# Patient Record
Sex: Female | Born: 1960 | Race: Black or African American | Hispanic: No | Smoking: Never smoker
Health system: Southern US, Community
[De-identification: ages and names within clinical notes are randomized; demographics above are authoritative.]

## PROBLEM LIST (undated history)

## (undated) DIAGNOSIS — I10 Essential (primary) hypertension: Secondary | ICD-10-CM

## (undated) HISTORY — PX: CHOLECYSTECTOMY: SHX55

## (undated) HISTORY — DX: Essential (primary) hypertension: I10

---

## 2009-05-18 ENCOUNTER — Ambulatory Visit (HOSPITAL_COMMUNITY): Admission: RE | Admit: 2009-05-18 | Discharge: 2009-05-18 | Payer: Self-pay | Admitting: Family Medicine

## 2011-06-14 ENCOUNTER — Other Ambulatory Visit (HOSPITAL_COMMUNITY): Payer: Self-pay | Admitting: Family Medicine

## 2011-06-14 ENCOUNTER — Ambulatory Visit (HOSPITAL_COMMUNITY)
Admission: RE | Admit: 2011-06-14 | Discharge: 2011-06-14 | Disposition: A | Discharge status: Home / Self Care | Payer: 59 | Source: Ambulatory Visit | Attending: Family Medicine | Admitting: Family Medicine

## 2011-06-14 DIAGNOSIS — M25549 Pain in joints of unspecified hand: Secondary | ICD-10-CM | POA: Insufficient documentation

## 2011-07-07 ENCOUNTER — Encounter: Payer: Self-pay | Admitting: Orthopedic Surgery

## 2011-07-07 ENCOUNTER — Ambulatory Visit (INDEPENDENT_AMBULATORY_CARE_PROVIDER_SITE_OTHER): Payer: 59 | Admitting: Orthopedic Surgery

## 2011-07-07 VITALS — Resp 16 | Ht 64.0 in | Wt 270.0 lb

## 2011-07-07 DIAGNOSIS — M6530 Trigger finger, unspecified finger: Secondary | ICD-10-CM

## 2011-07-07 DIAGNOSIS — M65311 Trigger thumb, right thumb: Secondary | ICD-10-CM

## 2011-07-07 MED ORDER — METHYLPREDNISOLONE ACETATE 40 MG/ML IJ SUSP: 40.0000 mg | Freq: Once | INTRAMUSCULAR | Status: AC

## 2011-07-07 NOTE — Patient Instructions (Signed)
Ice the thumb today 3 x day for 20 minutes  Take Tylenol for pain  Come back August 6th

## 2011-07-07 NOTE — Progress Notes (Signed)
   I cannot bend my RIGHT thumb.  Referral from [facility].  For the last 2 months. The patient states she can't bend her RIGHT thumb. She denies any trauma. Symptoms came on suddenly. The RIGHT thumb is swollen.  Review of systems is negative, except for seasonal allergies.  The history is been reviewed as above.  Exam vital signs are normal. General appearance body habitus is endomorphic. Orientation is normal. X3. Mood and affect are normal. Ambulation normal. RIGHT thumb: Inspection reveals tenderness over the A1 pulley. No tenderness over the IP joint. The flexor tendon remains intact. The thumb is stable. Overall muscle tone in the RIGHT hand is normal. The skin is intact. Her color is good. The digit. The sensation is normal as well.  Pathologic  reflexes such as Hoffman's test is negative. Balance is normal.  An x-ray was done at the hospital. There is a suggestion that there was a fracture at the DI P. Joint from the distal phalanx. However, clinically, this area is nontender with no swelling, and there appears to be nodularity at the A1 pulley. Diagnosis is trigger thumb RIGHT upper extremity.  Recommend injection.  Follow-up visit arranged   Procedure Injection RIGHT trigger thumb Verbal consent was obtained, followed by timeout procedure.  Ethyl chloride was used to prep the skin along with alcohol  Medication injected: 40 mg of Depo-Medrol and 2 cc 1% lidocaine.    Tolerated well without complication.

## 2011-07-20 ENCOUNTER — Ambulatory Visit (INDEPENDENT_AMBULATORY_CARE_PROVIDER_SITE_OTHER): Payer: 59 | Admitting: Orthopedic Surgery

## 2011-07-20 DIAGNOSIS — M6530 Trigger finger, unspecified finger: Secondary | ICD-10-CM

## 2011-07-20 NOTE — Progress Notes (Signed)
Finger RIGHT  Status post injection for triggering RIGHT thumb  Reports symptoms have resolved  She demonstrates normal flexion extension of the RIGHT thumb  Followup as needed improved

## 2012-10-05 ENCOUNTER — Other Ambulatory Visit (HOSPITAL_COMMUNITY): Payer: Self-pay | Admitting: Internal Medicine

## 2012-10-05 DIAGNOSIS — Z139 Encounter for screening, unspecified: Secondary | ICD-10-CM

## 2012-10-11 ENCOUNTER — Ambulatory Visit (HOSPITAL_COMMUNITY): Payer: 59

## 2012-10-15 ENCOUNTER — Ambulatory Visit (HOSPITAL_COMMUNITY)
Admission: RE | Admit: 2012-10-15 | Discharge: 2012-10-15 | Disposition: A | Discharge status: Home / Self Care | Payer: BC Managed Care – PPO | Source: Ambulatory Visit | Attending: Internal Medicine | Admitting: Internal Medicine

## 2012-10-15 DIAGNOSIS — Z1231 Encounter for screening mammogram for malignant neoplasm of breast: Secondary | ICD-10-CM | POA: Insufficient documentation

## 2012-10-15 DIAGNOSIS — Z139 Encounter for screening, unspecified: Secondary | ICD-10-CM

## 2016-04-29 ENCOUNTER — Other Ambulatory Visit (HOSPITAL_COMMUNITY): Payer: Self-pay | Admitting: Family Medicine

## 2016-04-29 DIAGNOSIS — Z1231 Encounter for screening mammogram for malignant neoplasm of breast: Secondary | ICD-10-CM

## 2016-05-12 ENCOUNTER — Ambulatory Visit (HOSPITAL_COMMUNITY)
Admission: RE | Admit: 2016-05-12 | Discharge: 2016-05-12 | Disposition: A | Discharge status: Home / Self Care | Payer: BLUE CROSS/BLUE SHIELD | Source: Ambulatory Visit | Attending: Family Medicine | Admitting: Family Medicine

## 2016-05-12 DIAGNOSIS — Z1231 Encounter for screening mammogram for malignant neoplasm of breast: Secondary | ICD-10-CM | POA: Insufficient documentation

## 2016-05-16 ENCOUNTER — Ambulatory Visit (HOSPITAL_COMMUNITY): Payer: Self-pay

## 2016-06-13 ENCOUNTER — Encounter (HOSPITAL_COMMUNITY): Payer: Self-pay | Admitting: *Deleted

## 2016-06-13 ENCOUNTER — Emergency Department (HOSPITAL_COMMUNITY)
Admission: EM | Admit: 2016-06-13 | Discharge: 2016-06-13 | Disposition: A | Discharge status: Home / Self Care | Payer: BLUE CROSS/BLUE SHIELD | Attending: Emergency Medicine | Admitting: Emergency Medicine

## 2016-06-13 ENCOUNTER — Emergency Department (HOSPITAL_COMMUNITY): Payer: BLUE CROSS/BLUE SHIELD

## 2016-06-13 DIAGNOSIS — Z791 Long term (current) use of non-steroidal anti-inflammatories (NSAID): Secondary | ICD-10-CM | POA: Insufficient documentation

## 2016-06-13 DIAGNOSIS — R519 Headache, unspecified: Secondary | ICD-10-CM

## 2016-06-13 DIAGNOSIS — Z7982 Long term (current) use of aspirin: Secondary | ICD-10-CM | POA: Insufficient documentation

## 2016-06-13 DIAGNOSIS — Z79899 Other long term (current) drug therapy: Secondary | ICD-10-CM | POA: Diagnosis not present

## 2016-06-13 DIAGNOSIS — M542 Cervicalgia: Secondary | ICD-10-CM | POA: Diagnosis present

## 2016-06-13 DIAGNOSIS — R51 Headache: Secondary | ICD-10-CM | POA: Diagnosis not present

## 2016-06-13 LAB — BASIC METABOLIC PANEL
Anion gap: 9 (ref 5–15)
BUN: 14 mg/dL (ref 6–20)
CO2: 26 mmol/L (ref 22–32)
CREATININE: 0.75 mg/dL (ref 0.44–1.00)
Calcium: 9.5 mg/dL (ref 8.9–10.3)
Chloride: 103 mmol/L (ref 101–111)
Glucose, Bld: 109 mg/dL — ABNORMAL HIGH (ref 65–99)
POTASSIUM: 3.6 mmol/L (ref 3.5–5.1)
SODIUM: 138 mmol/L (ref 135–145)

## 2016-06-13 LAB — CBC
HCT: 36.3 % (ref 36.0–46.0)
Hemoglobin: 11.5 g/dL — ABNORMAL LOW (ref 12.0–15.0)
MCH: 24.7 pg — AB (ref 26.0–34.0)
MCHC: 31.7 g/dL (ref 30.0–36.0)
MCV: 78.1 fL (ref 78.0–100.0)
PLATELETS: 321 10*3/uL (ref 150–400)
RBC: 4.65 MIL/uL (ref 3.87–5.11)
RDW: 14.6 % (ref 11.5–15.5)
WBC: 8.8 10*3/uL (ref 4.0–10.5)

## 2016-06-13 MED ORDER — FENTANYL CITRATE (PF) 100 MCG/2ML IJ SOLN
50.0000 ug | INTRAMUSCULAR | Status: DC | PRN
  Administered 2016-06-13: 50 ug via INTRAVENOUS
  Filled 2016-06-13: qty 2

## 2016-06-13 MED ORDER — KETOROLAC TROMETHAMINE 30 MG/ML IJ SOLN
INTRAMUSCULAR | Status: AC
  Filled 2016-06-13: qty 1

## 2016-06-13 MED ORDER — POVIDONE-IODINE 10 % EX SOLN
CUTANEOUS | Status: AC
  Filled 2016-06-13: qty 118

## 2016-06-13 MED ORDER — DIPHENHYDRAMINE HCL 50 MG/ML IJ SOLN
25.0000 mg | Freq: Once | INTRAMUSCULAR | Status: AC
  Administered 2016-06-13: 25 mg via INTRAVENOUS
  Filled 2016-06-13: qty 1

## 2016-06-13 MED ORDER — KETOROLAC TROMETHAMINE 15 MG/ML IJ SOLN
15.0000 mg | Freq: Once | INTRAMUSCULAR | Status: AC
  Administered 2016-06-13: 15 mg via INTRAVENOUS
  Filled 2016-06-13: qty 1

## 2016-06-13 MED ORDER — IOPAMIDOL (ISOVUE-370) INJECTION 76%
100.0000 mL | Freq: Once | INTRAVENOUS | Status: AC | PRN
  Administered 2016-06-13: 75 mL via INTRAVENOUS

## 2016-06-13 MED ORDER — SODIUM CHLORIDE 0.9 % IV BOLUS (SEPSIS)
1000.0000 mL | Freq: Once | INTRAVENOUS | Status: AC
  Administered 2016-06-13: 1000 mL via INTRAVENOUS

## 2016-06-13 MED ORDER — NAPROXEN 375 MG PO TABS
375.0000 mg | ORAL_TABLET | Freq: Two times a day (BID) | ORAL | Status: AC | PRN
Start: 2016-06-13 — End: ?

## 2016-06-13 MED ORDER — METOCLOPRAMIDE HCL 5 MG/ML IJ SOLN
10.0000 mg | Freq: Once | INTRAMUSCULAR | Status: AC
  Administered 2016-06-13: 10 mg via INTRAVENOUS
  Filled 2016-06-13: qty 2

## 2016-06-13 NOTE — ED Provider Notes (Signed)
CSN: [id number]     Arrival date & time 06/13/16  1101 History   First MD Initiated Contact with Patient 06/13/16 1120     Chief Complaint  Patient presents with  . Neck Pain  . Hypertension     (Consider location/radiation/quality/duration/timing/severity/associated sxs/prior Treatment) HPI Comments: 55 year old female with no smoking history, high blood pressure history compliant with medications presents with posterior headache and right neck pain since Saturday night. Patient has had this multiple times the past over this is more severe and lasting longer than normal. No neurologic symptoms. Patient went to primary doctor sent here for elevated blood pressure. Patient has no stroke or cardiac history. Pain has positional component. No recent neck manipulation or injury.  Patient is a 55 y.o. female presenting with neck pain and hypertension. The history is provided by the patient.  Neck Pain Associated symptoms: headaches   Associated symptoms: no chest pain and no fever   Hypertension Associated symptoms include headaches. Pertinent negatives include no chest pain, no abdominal pain and no shortness of breath.    Past Medical History  Diagnosis Date  . High blood pressure    Past Surgical History  Procedure Laterality Date  . Cholecystectomy    . Cesarean section  x 2   Family History  Problem Relation Age of Onset  . Arthritis    . Cancer     Social History  Substance Use Topics  . Smoking status: Never Smoker   . Smokeless tobacco: None  . Alcohol Use: No   OB History    No data available     Review of Systems  Constitutional: Negative for fever and chills.  HENT: Negative for congestion.   Eyes: Negative for visual disturbance.  Respiratory: Negative for shortness of breath.   Cardiovascular: Negative for chest pain.  Gastrointestinal: Negative for vomiting and abdominal pain.  Genitourinary: Negative for dysuria and flank pain.  Musculoskeletal: Positive  for neck pain. Negative for back pain and neck stiffness.  Skin: Negative for rash.  Neurological: Positive for headaches. Negative for light-headedness.      Allergies  Review of patient's allergies indicates no known allergies.  Home Medications   Prior to Admission medications   Medication Sig Start Date End Date Taking? Authorizing Provider  aspirin-acetaminophen-caffeine (EXCEDRIN MIGRAINE) [phone] MG tablet Take 1 tablet by mouth every 6 (six) hours as needed for headache.   Yes Historical Provider, MD  ibuprofen (ADVIL,MOTRIN) 400 MG tablet Take 400 mg by mouth every 6 (six) hours as needed.   Yes Historical Provider, MD  loratadine (CLARITIN) 10 MG tablet Take 10 mg by mouth daily.   Yes Historical Provider, MD  losartan-hydrochlorothiazide Mauri Reading) 50-12.5 MG per tablet  06/30/11  Yes Historical Provider, MD  naproxen (NAPROSYN) 375 MG tablet Take 1 tablet (375 mg total) by mouth 2 (two) times daily as needed. 06/13/16   Blane Ohara, MD   BP 162/80 mmHg  Pulse 75  Temp(Src) 98.1 F (36.7 C) (Oral)  Resp 18  Ht  (1.626 m)  Wt 277 lb (125.646 kg)  BMI 47.52 kg/m2  SpO2 100% Physical Exam  Constitutional: She is oriented to person, place, and time. She appears well-developed and well-nourished.  HENT:  Head: Normocephalic and atraumatic.  Eyes: Conjunctivae are normal. Right eye exhibits no discharge. Left eye exhibits no discharge.  Neck: Normal range of motion. Neck supple. No tracheal deviation present.  Cardiovascular: Normal rate, regular rhythm and intact distal pulses.   Pulmonary/Chest: Effort normal  and breath sounds normal.  Abdominal: Soft.  Musculoskeletal: She exhibits tenderness. She exhibits no edema.  Patient has mild tenderness to palpation of trapezius muscle on the right extending to paraspinal cervical region on the right. Neck supple no meningismus good range of motion horizontally with mild discomfort to the right.  Neurological: She is alert  and oriented to person, place, and time.  Skin: Skin is warm. No rash noted.  Psychiatric: She has a normal mood and affect.  Nursing note and vitals reviewed.   ED Course  Procedures (including critical care time) Labs Review Labs Reviewed  CBC - Abnormal; Notable for the following:    Hemoglobin 11.5 (*)    MCH 24.7 (*)    All other components within normal limits  BASIC METABOLIC PANEL - Abnormal; Notable for the following:    Glucose, Bld 109 (*)    All other components within normal limits    Imaging Review Ct Angio Head W/cm &/or Wo Cm  06/13/2016  CLINICAL DATA:  Right-sided neck pain and headache since June 11, 2016. EXAM: CT ANGIOGRAPHY HEAD AND NECK TECHNIQUE: Multidetector CT imaging of the head and neck was performed using the standard protocol during bolus administration of intravenous contrast. Multiplanar CT image reconstructions and MIPs were obtained to evaluate the vascular anatomy. Carotid stenosis measurements (when applicable) are obtained utilizing NASCET criteria, using the distal internal carotid diameter as the denominator. CONTRAST:  75 cc Isovue 370 intravenous COMPARISON:  None. FINDINGS: CT HEAD Brain: Normal. No evidence of acute infarction, hemorrhage, hydrocephalus, or mass lesion/mass effect. Vascular: No hyperdense vessel or unexpected calcification. Skull: Negative for fracture or focal lesion. Sinuses/Orbits: No acute finding. Other: None. CTA NECK Aortic arch: Limited by streak artifact from the patient shoulders. No evidence of dissection. Right carotid system: Smooth and widely patent. Unavoidable proximal limitation due to patient size. Left carotid system: Smooth widely patent. Limited proximally due to artifact. Vertebral arteries:Limited visualization proximally. Dominant on the right. Where seen widely patent and smooth. Skeleton: Negative. Other neck: No incidental mass or adenopathy seen in the neck. CTA HEAD Anterior circulation: Symmetric carotids.  No visible posterior communicating arteries. Anterior communicating artery is present. Symmetric branching. No evidence of major branch occlusion, flow limiting stenosis, or visible inflammatory vasculopathy. Posterior circulation: Right dominant vertebral artery. Dominant left PICA and right AICA. Venous sinuses: Patent Anatomic variants: None significant Delayed phase: Negative for mass. IMPRESSION: 1. Negative CTA.  No explanation for neck pain and headache. 2. Limited evaluation at the chest and lower neck due to overlapping soft tissues. Electronically Signed   By: Marnee SpringJonathon  Watts M.D.   On: 06/13/2016 14:20   Ct Angio Neck W/cm &/or Wo/cm  06/13/2016  CLINICAL DATA:  Right-sided neck pain and headache since June 11, 2016. EXAM: CT ANGIOGRAPHY HEAD AND NECK TECHNIQUE: Multidetector CT imaging of the head and neck was performed using the standard protocol during bolus administration of intravenous contrast. Multiplanar CT image reconstructions and MIPs were obtained to evaluate the vascular anatomy. Carotid stenosis measurements (when applicable) are obtained utilizing NASCET criteria, using the distal internal carotid diameter as the denominator. CONTRAST:  75 cc Isovue 370 intravenous COMPARISON:  None. FINDINGS: CT HEAD Brain: Normal. No evidence of acute infarction, hemorrhage, hydrocephalus, or mass lesion/mass effect. Vascular: No hyperdense vessel or unexpected calcification. Skull: Negative for fracture or focal lesion. Sinuses/Orbits: No acute finding. Other: None. CTA NECK Aortic arch: Limited by streak artifact from the patient shoulders. No evidence of dissection. Right carotid system:  Smooth and widely patent. Unavoidable proximal limitation due to patient size. Left carotid system: Smooth widely patent. Limited proximally due to artifact. Vertebral arteries:Limited visualization proximally. Dominant on the right. Where seen widely patent and smooth. Skeleton: Negative. Other neck: No incidental  mass or adenopathy seen in the neck. CTA HEAD Anterior circulation: Symmetric carotids. No visible posterior communicating arteries. Anterior communicating artery is present. Symmetric branching. No evidence of major branch occlusion, flow limiting stenosis, or visible inflammatory vasculopathy. Posterior circulation: Right dominant vertebral artery. Dominant left PICA and right AICA. Venous sinuses: Patent Anatomic variants: None significant Delayed phase: Negative for mass. IMPRESSION: 1. Negative CTA.  No explanation for neck pain and headache. 2. Limited evaluation at the chest and lower neck due to overlapping soft tissues. Electronically Signed   By: Marnee SpringJonathon  Watts M.D.   On: 06/13/2016 14:20   I have personally reviewed and evaluated these images and lab results as part of my medical decision-making.   EKG Interpretation None      MDM   Final diagnoses:  Neck pain on right side  Headache, unspecified headache type   Patient presents with gradual onset headache overall similar previous. With headache worse than normal lasting longer and significant right neck pain discussed plan for CT angiogram for further details for pain. Pain meds and blood work ordered.  CT angio unremarkable.   Results and differential diagnosis were discussed with the patient/parent/guardian. Xrays were independently reviewed by myself.  Close follow up outpatient was discussed, comfortable with the plan.   Medications  fentaNYL (SUBLIMAZE) injection 50 mcg (50 mcg Intravenous Given 06/13/16 1304)  ketorolac (TORADOL) 15 MG/ML injection 15 mg (not administered)  povidone-iodine (BETADINE) 10 % external solution (not administered)  sodium chloride 0.9 % bolus 1,000 mL (1,000 mLs Intravenous New Bag/Given 06/13/16 1243)  metoCLOPramide (REGLAN) injection 10 mg (10 mg Intravenous Given 06/13/16 1243)  diphenhydrAMINE (BENADRYL) injection 25 mg (25 mg Intravenous Given 06/13/16 1243)  iopamidol (ISOVUE-370) 76 %  injection 100 mL (75 mLs Intravenous Contrast Given 06/13/16 1347)    Filed Vitals:   06/13/16 1112 06/13/16 1230 06/13/16 1305 06/13/16 1413  BP: 162/86 163/101 162/90 162/80  Pulse: 87  74 75  Temp: 98.1 F (36.7 C)     TempSrc: Oral     Resp: 18  17 18   Height: 5\' 4"  (1.626 m)     Weight: 277 lb (125.646 kg)     SpO2: 100%  100%     Final diagnoses:  Neck pain on right side  Headache, unspecified headache type       Blane OharaJoshua Chue Berkovich, MD 06/13/16 1510

## 2016-06-13 NOTE — ED Notes (Signed)
Pt states she started having neck and lower head pain on Saturday night. Pt went to the doctor at her work today and was told to come here for HTN. Pt is alert and oriented upon triage.

## 2016-06-13 NOTE — Discharge Instructions (Signed)
If you were given medicines take as directed.  If you are on coumadin or contraceptives realize their levels and effectiveness is altered by many different medicines.  If you have any reaction (rash, tongues swelling, other) to the medicines stop taking and see a physician.    If your blood pressure was elevated in the ER make sure you follow up for management with a primary doctor or return for chest pain, shortness of breath or stroke symptoms.  Please follow up as directed and return to the ER or see a physician for new or worsening symptoms.  Thank you. Filed Vitals:   06/13/16 1112 06/13/16 1230 06/13/16 1305 06/13/16 1413  BP: 162/86 163/101 162/90 162/80  Pulse: 87  74 75  Temp: 98.1 F (36.7 C)     TempSrc: Oral     Resp: 18  17 18   Height: 5\' 4"  (1.626 m)     Weight: 277 lb (125.646 kg)     SpO2: 100%  100%

## 2017-07-24 ENCOUNTER — Other Ambulatory Visit (HOSPITAL_COMMUNITY): Payer: Self-pay | Admitting: Family Medicine

## 2017-07-24 DIAGNOSIS — Z1231 Encounter for screening mammogram for malignant neoplasm of breast: Secondary | ICD-10-CM

## 2017-07-31 ENCOUNTER — Ambulatory Visit (HOSPITAL_COMMUNITY): Payer: BLUE CROSS/BLUE SHIELD

## 2017-08-02 ENCOUNTER — Ambulatory Visit (HOSPITAL_COMMUNITY)
Admission: RE | Admit: 2017-08-02 | Discharge: 2017-08-02 | Disposition: A | Discharge status: Home / Self Care | Payer: Commercial Managed Care - PPO | Source: Ambulatory Visit | Attending: Family Medicine | Admitting: Family Medicine

## 2017-08-02 DIAGNOSIS — Z1231 Encounter for screening mammogram for malignant neoplasm of breast: Secondary | ICD-10-CM | POA: Diagnosis not present

## 2019-01-30 ENCOUNTER — Other Ambulatory Visit (HOSPITAL_COMMUNITY): Payer: Self-pay | Admitting: Family Medicine

## 2019-01-30 DIAGNOSIS — Z1231 Encounter for screening mammogram for malignant neoplasm of breast: Secondary | ICD-10-CM

## 2019-02-06 ENCOUNTER — Ambulatory Visit (HOSPITAL_COMMUNITY)
Admission: RE | Admit: 2019-02-06 | Discharge: 2019-02-06 | Disposition: A | Discharge status: Home / Self Care | Payer: BC Managed Care – PPO | Source: Ambulatory Visit | Attending: Family Medicine | Admitting: Family Medicine

## 2019-02-06 DIAGNOSIS — Z1231 Encounter for screening mammogram for malignant neoplasm of breast: Secondary | ICD-10-CM | POA: Diagnosis not present

## 2020-03-05 ENCOUNTER — Other Ambulatory Visit (HOSPITAL_COMMUNITY): Payer: Self-pay | Admitting: Family Medicine

## 2020-03-05 DIAGNOSIS — Z1231 Encounter for screening mammogram for malignant neoplasm of breast: Secondary | ICD-10-CM

## 2020-03-09 ENCOUNTER — Other Ambulatory Visit: Payer: Self-pay

## 2020-03-09 ENCOUNTER — Ambulatory Visit (HOSPITAL_COMMUNITY)
Admission: RE | Admit: 2020-03-09 | Discharge: 2020-03-09 | Disposition: A | Discharge status: Home / Self Care | Payer: BC Managed Care – PPO | Source: Ambulatory Visit | Attending: Family Medicine | Admitting: Family Medicine

## 2020-03-09 DIAGNOSIS — Z1231 Encounter for screening mammogram for malignant neoplasm of breast: Secondary | ICD-10-CM

## 2021-03-22 ENCOUNTER — Other Ambulatory Visit (HOSPITAL_COMMUNITY): Payer: Self-pay | Admitting: Family Medicine

## 2021-03-22 DIAGNOSIS — Z1231 Encounter for screening mammogram for malignant neoplasm of breast: Secondary | ICD-10-CM

## 2021-03-29 ENCOUNTER — Other Ambulatory Visit: Payer: Self-pay

## 2021-03-29 ENCOUNTER — Ambulatory Visit (HOSPITAL_COMMUNITY)
Admission: RE | Admit: 2021-03-29 | Discharge: 2021-03-29 | Disposition: A | Discharge status: Home / Self Care | Payer: BC Managed Care – PPO | Source: Ambulatory Visit | Attending: Family Medicine | Admitting: Family Medicine

## 2021-03-29 DIAGNOSIS — Z1231 Encounter for screening mammogram for malignant neoplasm of breast: Secondary | ICD-10-CM | POA: Diagnosis not present

## 2022-03-21 ENCOUNTER — Other Ambulatory Visit (HOSPITAL_COMMUNITY): Payer: Self-pay | Admitting: Family Medicine

## 2022-03-21 DIAGNOSIS — Z1231 Encounter for screening mammogram for malignant neoplasm of breast: Secondary | ICD-10-CM

## 2022-04-04 ENCOUNTER — Ambulatory Visit (HOSPITAL_COMMUNITY)
Admission: RE | Admit: 2022-04-04 | Discharge: 2022-04-04 | Disposition: A | Discharge status: Home / Self Care | Payer: BC Managed Care – PPO | Source: Ambulatory Visit | Attending: Family Medicine | Admitting: Family Medicine

## 2022-04-04 DIAGNOSIS — Z1231 Encounter for screening mammogram for malignant neoplasm of breast: Secondary | ICD-10-CM | POA: Insufficient documentation

## 2022-04-25 ENCOUNTER — Ambulatory Visit: Payer: BC Managed Care – PPO | Admitting: Dietician

## 2022-04-26 IMAGING — MG MM DIGITAL SCREENING BILAT W/ TOMO AND CAD
6 of 12 series · 6 of 36 positions shown · non-contrast
Comparison: Previous exam(s).

ACR Breast Density Category a: The breast tissue is almost entirely
fatty.

CLINICAL DATA: Screening.

EXAM:
DIGITAL SCREENING BILATERAL MAMMOGRAM WITH TOMOSYNTHESIS AND CAD
TECHNIQUE: Bilateral screening digital craniocaudal and mediolateral oblique
mammograms were obtained. Bilateral screening digital breast
tomosynthesis was performed. The images were evaluated with
computer-aided detection.

[R CC synth-2D]
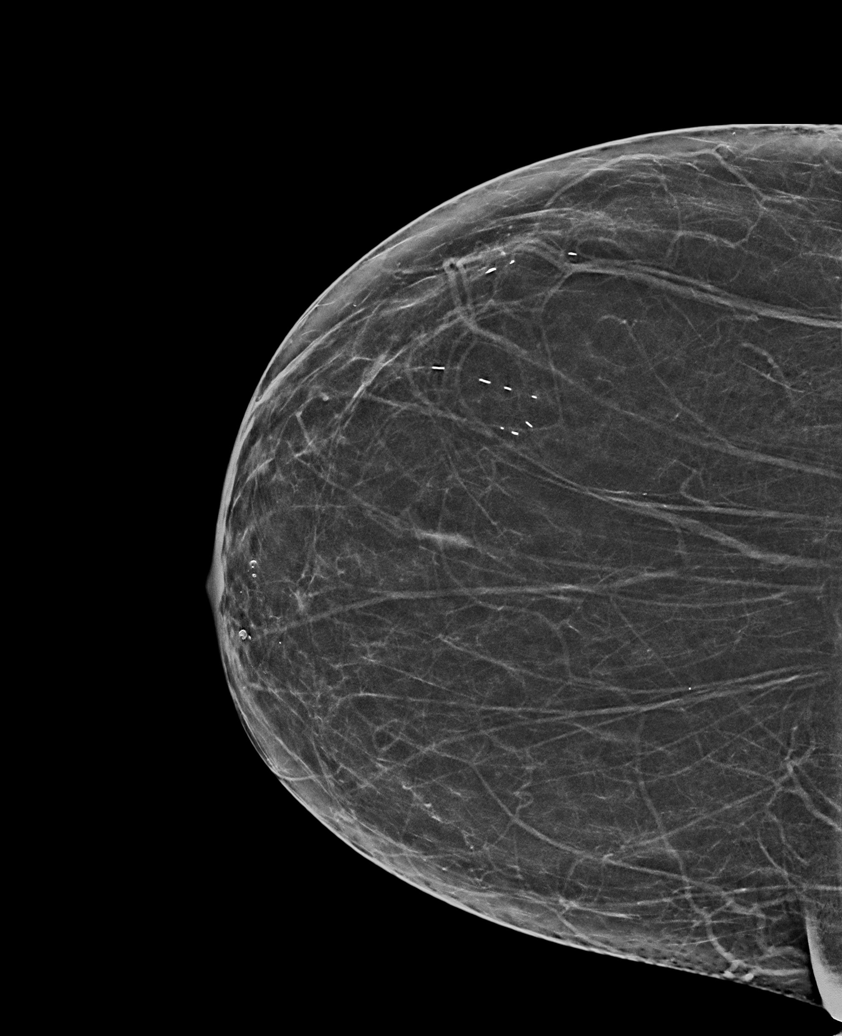

[L MLO synth-2D]
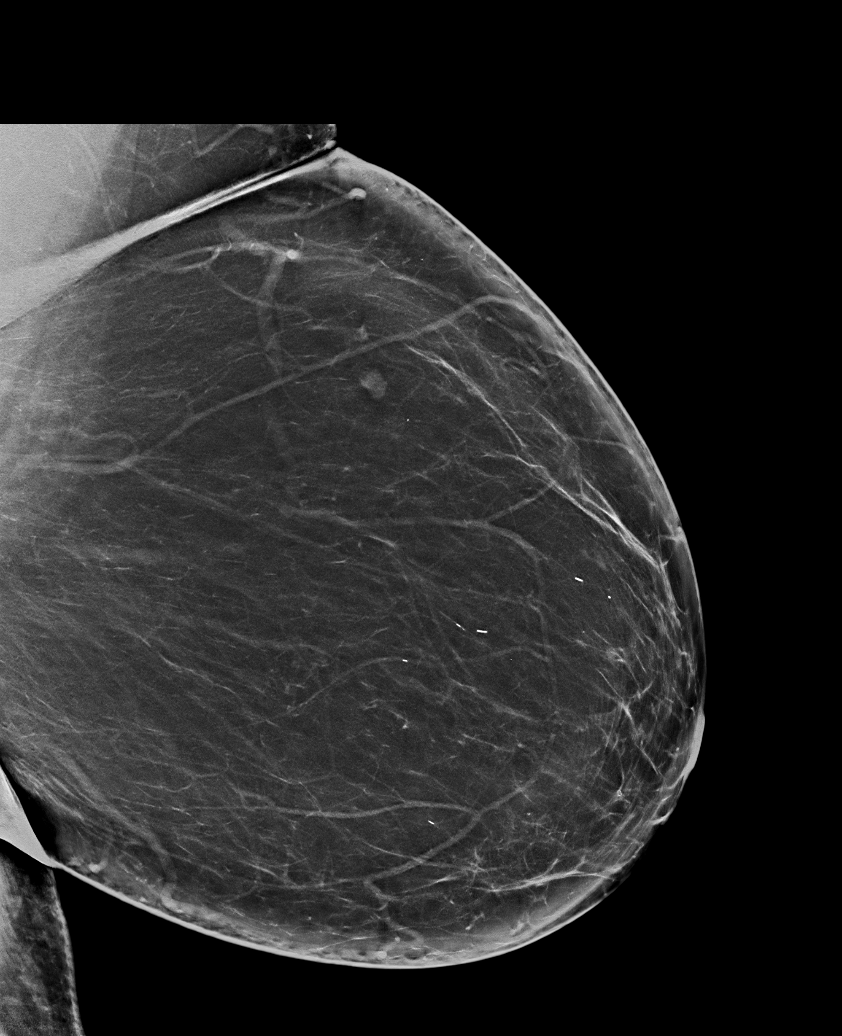

[R MLO synth-2D (1 of 2)]
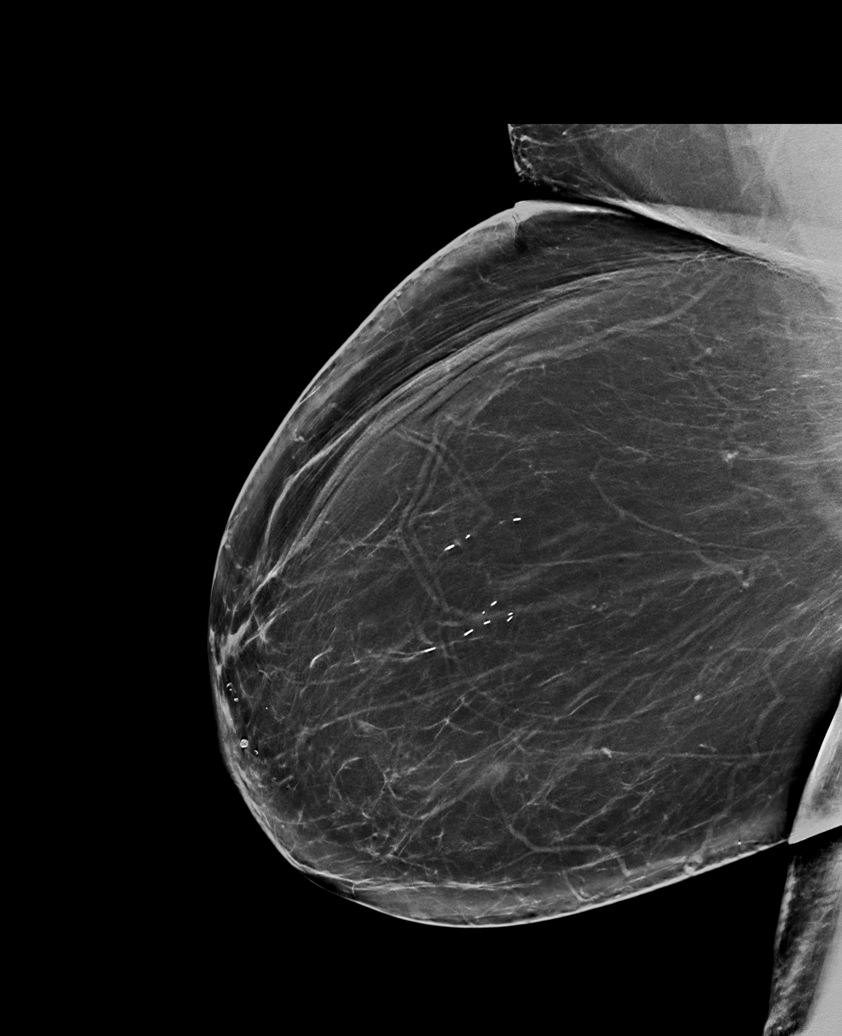

[L CV synth-2D]
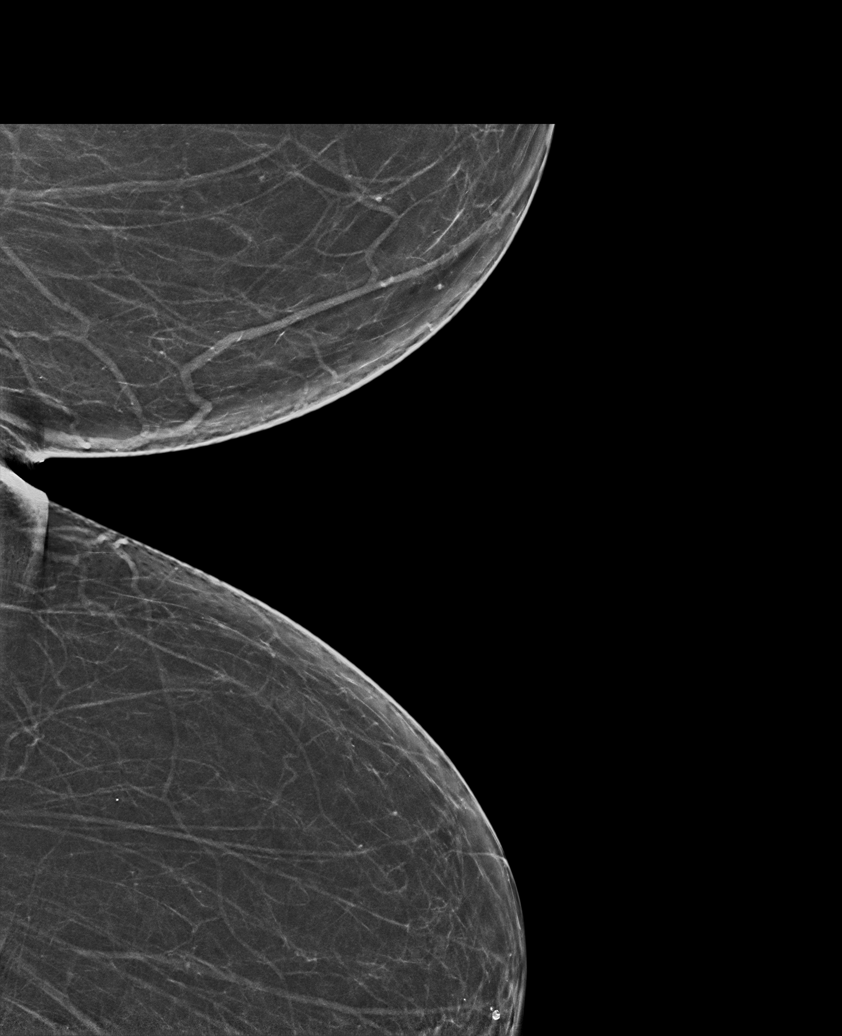

[L CC synth-2D]
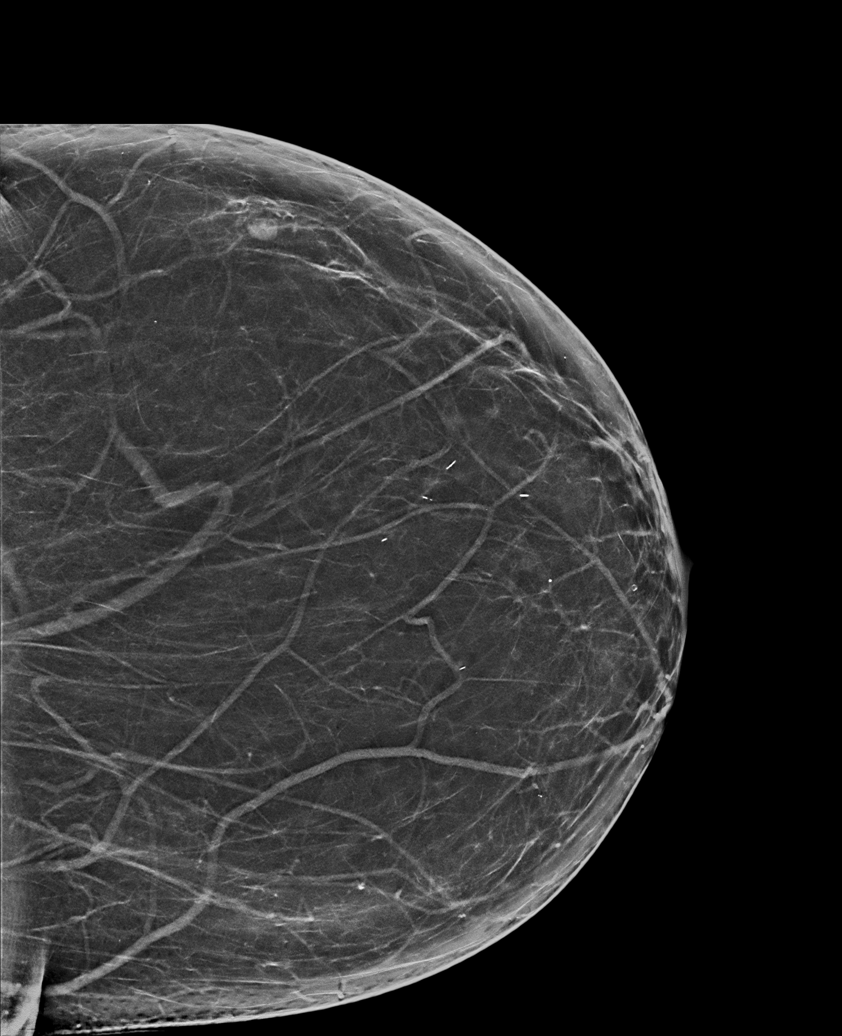

[R MLO synth-2D (2 of 2)]
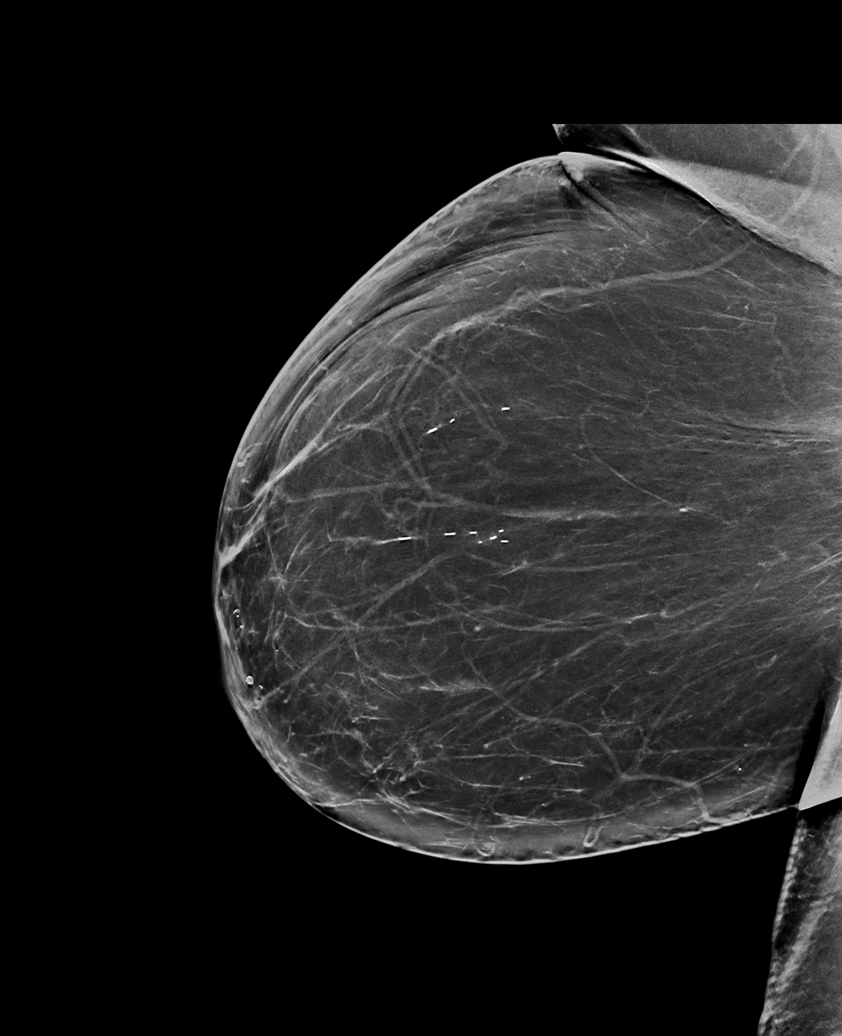

[6 of 36 positions shown; findings below may reference images not displayed]

FINDINGS: There are no findings suspicious for malignancy.
IMPRESSION: No mammographic evidence of malignancy. A result letter of this
screening mammogram will be mailed directly to the patient.

RECOMMENDATION:
Screening mammogram in one year. (Code:0E-3-N98)

BI-RADS CATEGORY  1: Negative.

## 2022-10-26 ENCOUNTER — Encounter (INDEPENDENT_AMBULATORY_CARE_PROVIDER_SITE_OTHER): Payer: Self-pay

## 2023-01-04 ENCOUNTER — Encounter (INDEPENDENT_AMBULATORY_CARE_PROVIDER_SITE_OTHER): Payer: Self-pay | Admitting: Internal Medicine

## 2023-01-04 ENCOUNTER — Ambulatory Visit (INDEPENDENT_AMBULATORY_CARE_PROVIDER_SITE_OTHER): Payer: BC Managed Care – PPO | Admitting: Internal Medicine

## 2023-01-04 VITALS — BP 132/85 | HR 102 | Temp 98.0°F | Ht 63.0 in

## 2023-01-04 DIAGNOSIS — E669 Obesity, unspecified: Secondary | ICD-10-CM

## 2023-01-04 DIAGNOSIS — Z6841 Body Mass Index (BMI) 40.0 and over, adult: Secondary | ICD-10-CM

## 2023-01-04 DIAGNOSIS — Z7985 Long-term (current) use of injectable non-insulin antidiabetic drugs: Secondary | ICD-10-CM

## 2023-01-04 DIAGNOSIS — I10 Essential (primary) hypertension: Secondary | ICD-10-CM

## 2023-01-04 DIAGNOSIS — E119 Type 2 diabetes mellitus without complications: Secondary | ICD-10-CM | POA: Insufficient documentation

## 2023-01-04 DIAGNOSIS — E1169 Type 2 diabetes mellitus with other specified complication: Secondary | ICD-10-CM | POA: Diagnosis not present

## 2023-01-04 NOTE — Assessment & Plan Note (Signed)
We reviewed weight, biometrics, associated medical conditions and contributing factors with patient. She would benefit from weight loss therapy via a modified calorie, low-carb, high-protein nutritional plan tailored to their REE (resting energy expenditure) which will be determined by indirect calorimetry.  We will also assess for cardiometabolic risk and nutritional derangements via fasting serologies at her next appointment. 

## 2023-01-04 NOTE — Assessment & Plan Note (Signed)
Unknown hemoglobin A1c.  She is currently on Ozempic 1 mg a week prescribed by PCP.  She has lost 10 pounds.  She denies any adverse effects.  We will check fasting blood glucose, hemoglobin A1c, fasting lipid panel and UMA with intake labs.  Losing 10% of body weight may improve glycemic control.  Continue GLP-1 medication for both diabetes and weight management.

## 2023-01-04 NOTE — Progress Notes (Signed)
Office: [phone]  /  Fax: [phone]   Initial Visit  Krystal Hansen was seen in clinic today to evaluate for obesity. She is interested in losing weight to improve overall health and reduce the risk of weight related complications. She presents today to review program treatment options, initial physical assessment, and evaluation.  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  She reports gaining weight after her pregnancies.  She has type 2 diabetes and has been on Ozempic now for 3 months.  She has lost 10 pounds.  She was referred by: PCP  When asked what else they would like to accomplish? She states: Improve existing medical conditions and Reduce number of medications  When asked how has your weight affected you? She states: Contributed to medical problems and Problems with eating patterns  Some associated conditions: Hypertension, Hyperlipidemia, and Diabetes  Contributing factors: Disruption of circadian rhythm, Nutritional, Stress, Reduced physical activity, and Pregnancy  Weight promoting medications identified: None  Current nutrition plan: None  Current level of physical activity: Walking  Current or previous pharmacotherapy: GLP-1 and Phentermine  Response to medication: Lost weight and was able to maintain weight loss   Past medical history includes:   Past Medical History:  Diagnosis Date   High blood pressure      Objective:   BP 132/85   Pulse (!) 102   Temp 98 F (36.7 C)   Ht 5\' 3"  (1.6 m)   SpO2 98%   BMI 49.07 kg/m  She was weighed on the bioimpedance scale: Body mass index is 49.07 kg/m.  Peak Weight: 285, Body Fat%: 55, Visceral Fat Rating: 22, Weight trend over the last 12 months: Unchanged  General:  Alert, oriented and cooperative. Patient is in no acute distress.  Respiratory: Normal respiratory effort, no problems with respiration noted  Extremities: Normal range of motion.    Mental Status: Normal mood and affect. Normal behavior. Normal  judgment and thought content.   DIAGNOSTIC DATA REVIEWED:  BMET    Component Value Date/Time   NA 138 06/13/2016 1236   K 3.6 06/13/2016 1236   CL 103 06/13/2016 1236   CO2 26 06/13/2016 1236   GLUCOSE 109 (H) 06/13/2016 1236   BUN 14 06/13/2016 1236   CREATININE 0.75 06/13/2016 1236   CALCIUM 9.5 06/13/2016 1236   GFRNONAA >60 06/13/2016 1236   GFRAA >60 06/13/2016 1236   No results found for: "HGBA1C" No results found for: "INSULIN" CBC    Component Value Date/Time   WBC 8.8 06/13/2016 1236   RBC 4.65 06/13/2016 1236   HGB 11.5 (L) 06/13/2016 1236   HCT 36.3 06/13/2016 1236   PLT 321 06/13/2016 1236   MCV 78.1 06/13/2016 1236   MCH 24.7 (L) 06/13/2016 1236   MCHC 31.7 06/13/2016 1236   RDW 14.6 06/13/2016 1236   Iron/TIBC/Ferritin/ %Sat No results found for: "IRON", "TIBC", "FERRITIN", "IRONPCTSAT" Lipid Panel  No results found for: "CHOL", "TRIG", "HDL", "CHOLHDL", "VLDL", "LDLCALC", "LDLDIRECT" Hepatic Function Panel  No results found for: "PROT", "ALBUMIN", "AST", "ALT", "ALKPHOS", "BILITOT", "BILIDIR", "IBILI" No results found for: "TSH"   Assessment and Plan:  There are no diagnoses linked to this encounter.      Obesity Treatment / Action Plan:  Patient will work on garnering support from family and friends to begin weight loss journey. Will work on eliminating or reducing the presence of highly palatable, calorie dense foods in the home. Will complete provided nutritional and psychosocial assessment questionnaire before the  next appointment. Will be scheduled for indirect calorimetry to determine resting energy expenditure in a fasting state.  This will allow [place] to create a reduced calorie, high-protein meal plan to promote loss of fat mass while preserving muscle mass. Counseled on the health benefits of losing 5%-15% of total body weight. Was counseled on nutritional approaches to weight loss and benefits of complex carbs and high quality protein as  part of nutritional weight management. Was counseled on pharmacotherapy and role as an adjunct in weight management.   Obesity Education Performed Today:  She was weighed on the bioimpedance scale and results were discussed and documented in the synopsis.  We discussed obesity as a disease and the importance of a more detailed evaluation of all the factors contributing to the disease.  We discussed the importance of long term lifestyle changes which include nutrition, exercise and behavioral modifications as well as the importance of customizing this to her specific health and social needs.  We discussed the benefits of reaching a healthier weight to alleviate the symptoms of existing conditions and reduce the risks of the biomechanical, metabolic and psychological effects of obesity.  Krystal Hansen appears to be in the action stage of change and states they are ready to start intensive lifestyle modifications and behavioral modifications.  30 minutes was spent today on this visit including the above counseling, pre-visit chart review, and post-visit documentation.  Reviewed by clinician on day of visit: allergies, medications, problem list, medical history, surgical history, family history, social history, and previous encounter notes.    I have reviewed the above documentation for accuracy and completeness, and I agree with the above.  Thomes Dinning, MD

## 2023-01-06 NOTE — Assessment & Plan Note (Signed)
Blood pressure at goal for age and risk category.  On losartan 50 mg hydrochlorothiazide 12.5 without adverse effects.  Most recent renal parameters reviewed which showed normal electrolytes and kidney function.  Continue with weight loss therapy.  Monitor for symptoms of orthostasis while losing weight. Continue current regimen and home monitoring for a goal blood pressure of 120/80.

## 2023-04-18 ENCOUNTER — Other Ambulatory Visit (HOSPITAL_COMMUNITY): Payer: Self-pay | Admitting: Family Medicine

## 2023-04-18 DIAGNOSIS — Z1231 Encounter for screening mammogram for malignant neoplasm of breast: Secondary | ICD-10-CM

## 2023-04-26 ENCOUNTER — Ambulatory Visit (HOSPITAL_COMMUNITY)
Admission: RE | Admit: 2023-04-26 | Discharge: 2023-04-26 | Disposition: A | Discharge status: Home / Self Care | Payer: BC Managed Care – PPO | Source: Ambulatory Visit | Attending: Family Medicine | Admitting: Family Medicine

## 2023-04-26 DIAGNOSIS — Z1231 Encounter for screening mammogram for malignant neoplasm of breast: Secondary | ICD-10-CM | POA: Diagnosis present

## 2024-07-01 ENCOUNTER — Other Ambulatory Visit (HOSPITAL_COMMUNITY): Payer: Self-pay | Admitting: Family Medicine

## 2024-07-01 DIAGNOSIS — Z1231 Encounter for screening mammogram for malignant neoplasm of breast: Secondary | ICD-10-CM

## 2024-07-08 ENCOUNTER — Ambulatory Visit (HOSPITAL_COMMUNITY): Payer: Self-pay

## 2024-07-15 ENCOUNTER — Ambulatory Visit (HOSPITAL_COMMUNITY)
Admission: RE | Admit: 2024-07-15 | Discharge: 2024-07-15 | Disposition: A | Discharge status: Home / Self Care | Payer: Self-pay | Source: Ambulatory Visit | Attending: Family Medicine | Admitting: Family Medicine

## 2024-07-15 ENCOUNTER — Encounter (HOSPITAL_COMMUNITY): Payer: Self-pay

## 2024-07-15 DIAGNOSIS — Z1231 Encounter for screening mammogram for malignant neoplasm of breast: Secondary | ICD-10-CM | POA: Insufficient documentation
